# Patient Record
Sex: Male | Born: 1990 | Race: White | Hispanic: No | Marital: Single | Smoking: Never smoker
Health system: Southern US, Community
[De-identification: ages and names within clinical notes are randomized; demographics above are authoritative.]

---

## 2009-02-25 ENCOUNTER — Emergency Department (HOSPITAL_COMMUNITY): Admission: EM | Admit: 2009-02-25 | Discharge: 2009-02-25 | Payer: Self-pay | Admitting: Emergency Medicine
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
## 2016-09-23 ENCOUNTER — Encounter (HOSPITAL_COMMUNITY): Payer: Self-pay | Admitting: Emergency Medicine

## 2016-09-23 DIAGNOSIS — R002 Palpitations: Secondary | ICD-10-CM | POA: Diagnosis present

## 2016-09-23 DIAGNOSIS — R000 Tachycardia, unspecified: Secondary | ICD-10-CM | POA: Diagnosis not present
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
## 2016-09-24 ENCOUNTER — Emergency Department (HOSPITAL_COMMUNITY)

## 2016-09-24 ENCOUNTER — Emergency Department (HOSPITAL_COMMUNITY)
Admission: EM | Admit: 2016-09-24 | Discharge: 2016-09-24 | Disposition: A | Discharge status: Home / Self Care | Attending: Emergency Medicine | Admitting: Emergency Medicine

## 2016-09-24 DIAGNOSIS — R002 Palpitations: Secondary | ICD-10-CM

## 2016-09-24 LAB — CBC
HEMATOCRIT: 43.6 % (ref 39.0–52.0)
HEMOGLOBIN: 15.4 g/dL (ref 13.0–17.0)
MCH: 31.8 pg (ref 26.0–34.0)
MCHC: 35.3 g/dL (ref 30.0–36.0)
MCV: 89.9 fL (ref 78.0–100.0)
PLATELETS: 194 10*3/uL (ref 150–400)
RBC: 4.85 MIL/uL (ref 4.22–5.81)
RDW: 12.1 % (ref 11.5–15.5)
WBC: 7.2 10*3/uL (ref 4.0–10.5)

## 2016-09-24 LAB — BASIC METABOLIC PANEL
ANION GAP: 12 (ref 5–15)
BUN: 20 mg/dL (ref 6–20)
CHLORIDE: 104 mmol/L (ref 101–111)
CO2: 22 mmol/L (ref 22–32)
Calcium: 9.2 mg/dL (ref 8.9–10.3)
Creatinine, Ser: 1.18 mg/dL (ref 0.61–1.24)
GFR calc Af Amer: >60 mL/min (ref >60)
GFR calc non Af Amer: >60 mL/min (ref >60)
Glucose, Bld: 106 mg/dL — ABNORMAL HIGH (ref 65–99)
POTASSIUM: 3.4 mmol/L — AB (ref 3.5–5.1)
SODIUM: 138 mmol/L (ref 135–145)

## 2016-09-24 LAB — I-STAT TROPONIN, ED: Troponin i, poc: 0 ng/mL (ref 0.00–0.08)

## 2016-09-24 NOTE — Discharge Instructions (Signed)
Try to avoid stimulants.  Try vagal maneuvers if this happens again.  If it doesn't resolve after a couple times return.
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
## 2019-02-14 IMAGING — DX DG CHEST 2V
2 series · 2 of 2 positions shown · non-contrast
Comparison: None.

CLINICAL DATA: Heart racing and shortness of breath. Lightheaded
feeling. Recent heart problems.

EXAM:
CHEST  2 VIEW

[chest pa]
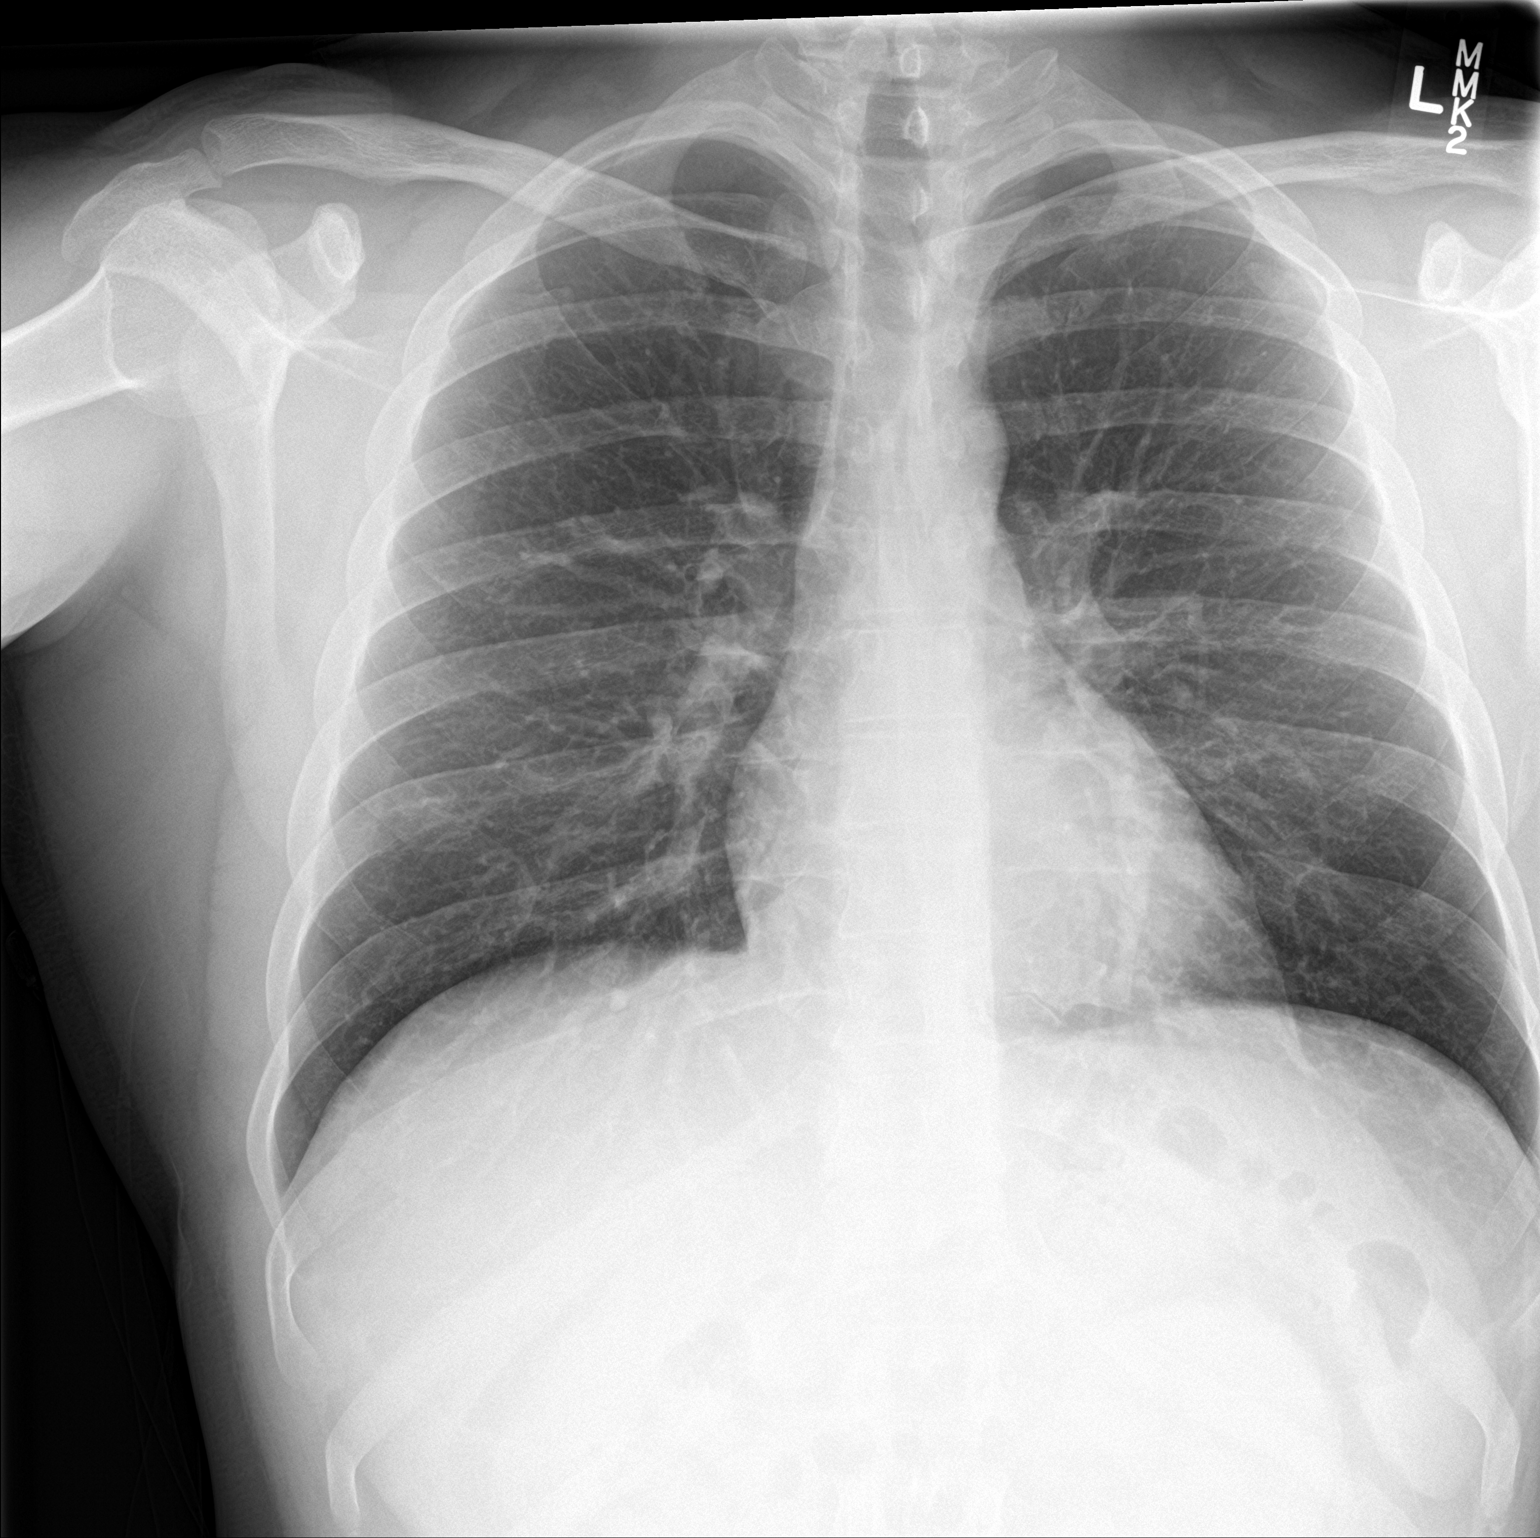

[chest lat]
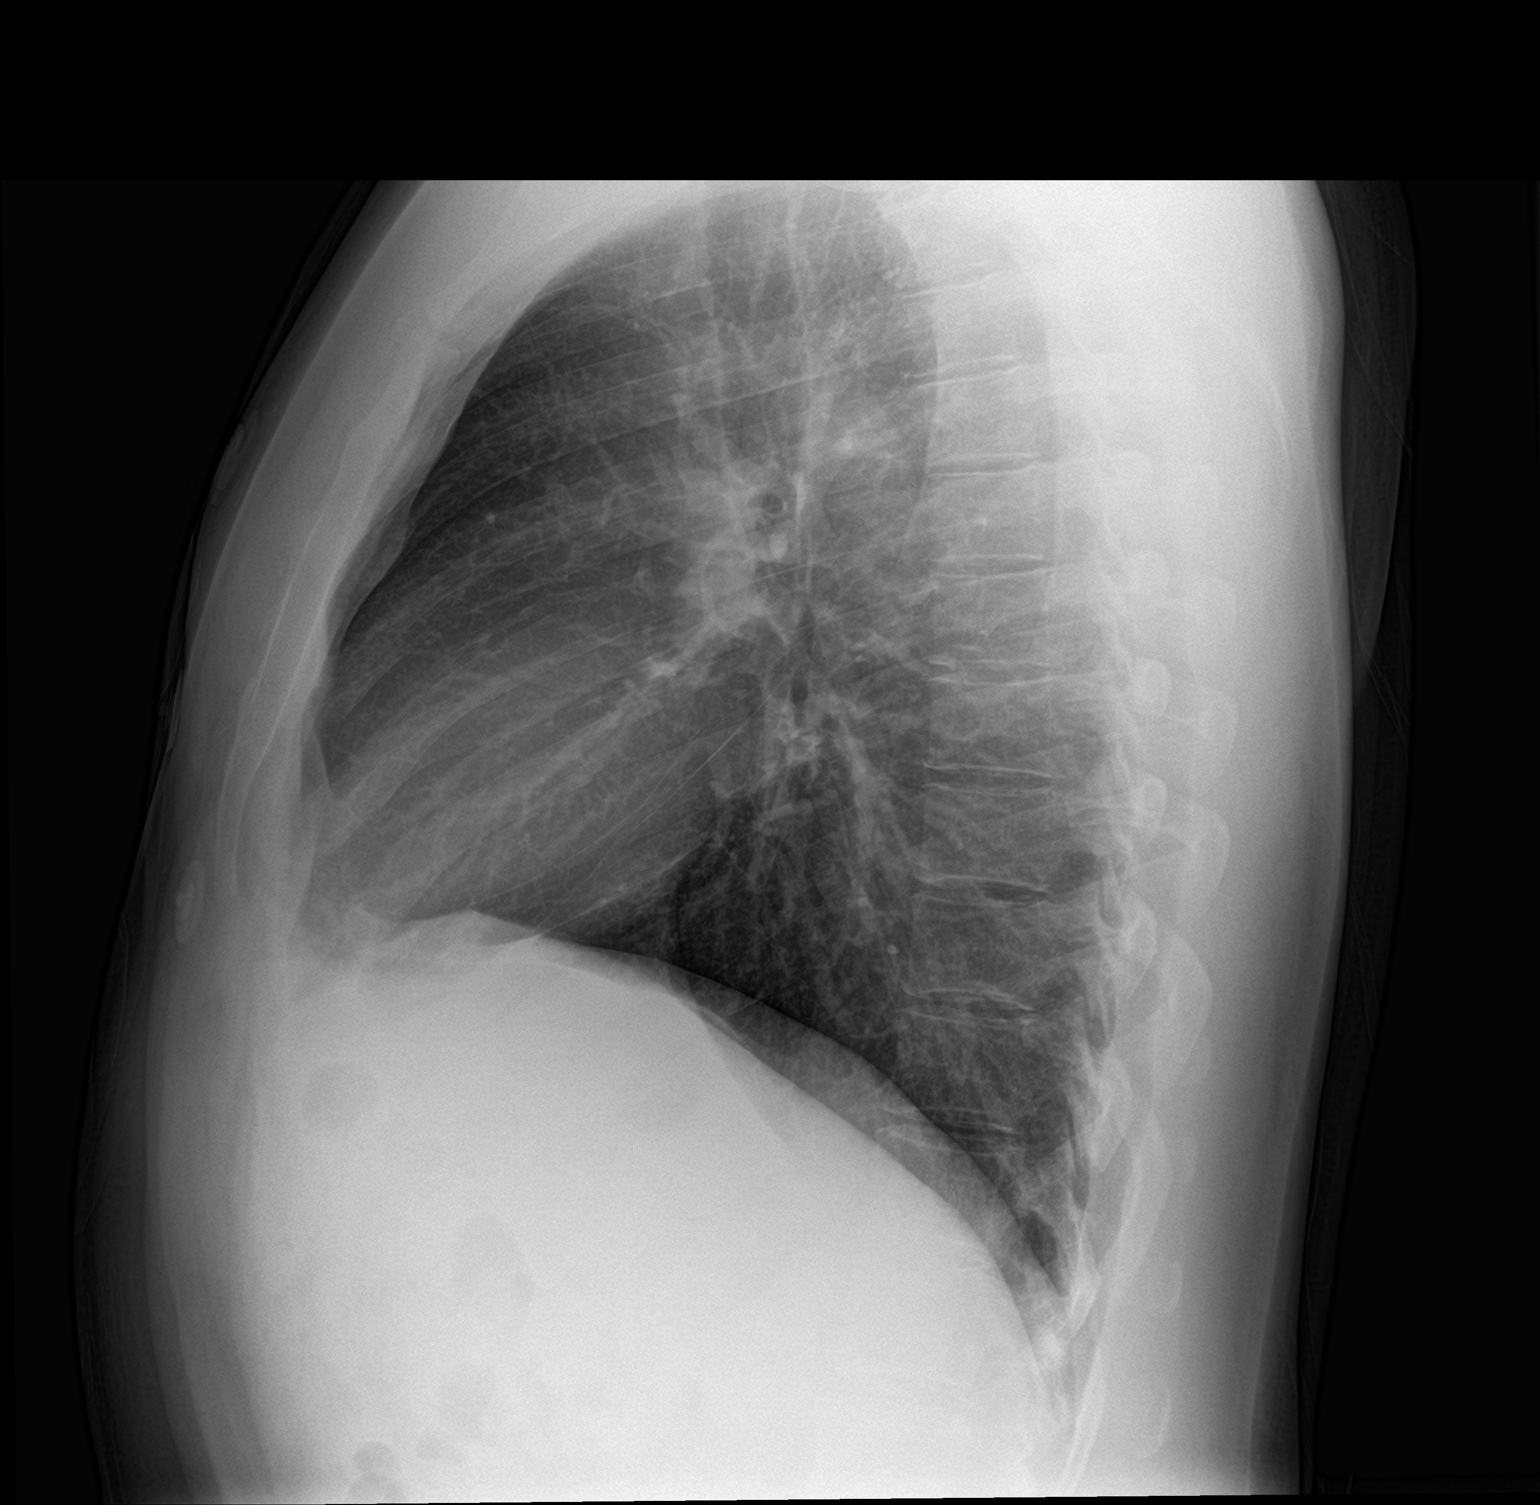

[2 of 2 positions shown; findings below may reference images not displayed]

FINDINGS: The heart size and mediastinal contours are within normal limits.
Both lungs are clear. The visualized skeletal structures are
unremarkable.
IMPRESSION: No active cardiopulmonary disease.
